# Patient Record
Sex: Male | Born: 1980 | Race: White | Hispanic: No | Marital: Married | Smoking: Current every day smoker
Health system: Southern US, Community
[De-identification: ages and names within clinical notes are randomized; demographics above are authoritative.]

## PROBLEM LIST (undated history)

## (undated) DIAGNOSIS — F1910 Other psychoactive substance abuse, uncomplicated: Secondary | ICD-10-CM

---

## 2015-12-30 ENCOUNTER — Emergency Department
Admission: EM | Admit: 2015-12-30 | Discharge: 2015-12-30 | Discharge status: Against Medical Advice | Payer: Self-pay | Attending: Emergency Medicine | Admitting: Emergency Medicine

## 2015-12-30 ENCOUNTER — Encounter: Payer: Self-pay | Admitting: Emergency Medicine

## 2015-12-30 ENCOUNTER — Emergency Department: Payer: Self-pay

## 2015-12-30 DIAGNOSIS — Z9189 Other specified personal risk factors, not elsewhere classified: Secondary | ICD-10-CM | POA: Insufficient documentation

## 2015-12-30 DIAGNOSIS — F17200 Nicotine dependence, unspecified, uncomplicated: Secondary | ICD-10-CM | POA: Insufficient documentation

## 2015-12-30 HISTORY — DX: Other psychoactive substance abuse, uncomplicated: F19.10

## 2015-12-30 LAB — COMPREHENSIVE METABOLIC PANEL
ALBUMIN: 4.4 g/dL (ref 3.5–5.0)
ALT: 15 U/L — AB (ref 17–63)
AST: 27 U/L (ref 15–41)
Alkaline Phosphatase: 75 U/L (ref 38–126)
Anion gap: 9 (ref 5–15)
BUN: 10 mg/dL (ref 6–20)
CALCIUM: 9.3 mg/dL (ref 8.9–10.3)
CHLORIDE: 106 mmol/L (ref 101–111)
CO2: 22 mmol/L (ref 22–32)
CREATININE: 1.04 mg/dL (ref 0.61–1.24)
GFR calc non Af Amer: >60 mL/min (ref >60)
GLUCOSE: 127 mg/dL — AB (ref 65–99)
Potassium: 3.7 mmol/L (ref 3.5–5.1)
SODIUM: 137 mmol/L (ref 135–145)
Total Bilirubin: 0.8 mg/dL (ref 0.3–1.2)
Total Protein: 7.5 g/dL (ref 6.5–8.1)

## 2015-12-30 LAB — PROTIME-INR
INR: 1.14
Prothrombin Time: 14.8 seconds (ref 11.4–15.0)

## 2015-12-30 LAB — CBC WITH DIFFERENTIAL/PLATELET
BASOS ABS: 0 10*3/uL (ref 0–0.1)
Basophils Relative: 0 %
Eosinophils Absolute: 0.1 10*3/uL (ref 0–0.7)
HCT: 40.2 % (ref 40.0–52.0)
Hemoglobin: 13.9 g/dL (ref 13.0–18.0)
Lymphs Abs: 1.7 10*3/uL (ref 1.0–3.6)
MCH: 29 pg (ref 26.0–34.0)
MCHC: 34.5 g/dL (ref 32.0–36.0)
MCV: 84.2 fL (ref 80.0–100.0)
MONO ABS: 0.8 10*3/uL (ref 0.2–1.0)
Neutro Abs: 7.5 10*3/uL — ABNORMAL HIGH (ref 1.4–6.5)
Neutrophils Relative %: 74 %
PLATELETS: 217 10*3/uL (ref 150–440)
RBC: 4.78 MIL/uL (ref 4.40–5.90)
RDW: 12.7 % (ref 11.5–14.5)
WBC: 10.1 10*3/uL (ref 3.8–10.6)

## 2015-12-30 LAB — ACETAMINOPHEN LEVEL

## 2015-12-30 LAB — ETHANOL: Alcohol, Ethyl (B): <5 mg/dL (ref <5)

## 2015-12-30 LAB — TROPONIN I

## 2015-12-30 NOTE — ED Notes (Signed)

## 2015-12-30 NOTE — ED Notes (Signed)
He is standing at the nurse's station on B side - anxiously talking with the secretary  "I want to see my wife."  I escorted him to the lobby  He was informed that he can ask questions about the whereabouts of his wife in the lobby

## 2015-12-30 NOTE — ED Notes (Signed)
Pt changed out from street cloths to behavior clothing

## 2015-12-30 NOTE — ED Notes (Signed)
Pt to CT scan at this time.

## 2015-12-30 NOTE — ED Notes (Signed)
I spoke with McShane about what med he would like to give this pt  MD reports "no narcotics"  I informed the pt of the md's findings  Pt states  "Man this is bullshit - I am just gonna leave - I am voluntary - I am gonna leave."

## 2015-12-30 NOTE — ED Notes (Signed)
Pt left ama 

## 2015-12-30 NOTE — ED Notes (Signed)
MD informed of pts desire to leave - CT results are negative  MD aware

## 2015-12-30 NOTE — ED Notes (Signed)
Pt requesting methadone since he missed his appt two days ago  Pt states  "I have been off my methadone for two days and the doctor said he was going to order me some - go get it."
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
## 2015-12-30 NOTE — ED Notes (Signed)
2/2 bags of belongings have been returned to him and he has verbalized that he received back all belongings that he came here with
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
## 2017-05-21 ENCOUNTER — Encounter: Payer: Self-pay | Admitting: Emergency Medicine

## 2017-05-21 ENCOUNTER — Emergency Department
Admission: EM | Admit: 2017-05-21 | Discharge: 2017-05-21 | Disposition: A | Discharge status: Home / Self Care | Payer: Medicaid Other | Attending: Emergency Medicine | Admitting: Emergency Medicine

## 2017-05-21 DIAGNOSIS — L03114 Cellulitis of left upper limb: Secondary | ICD-10-CM | POA: Diagnosis not present

## 2017-05-21 DIAGNOSIS — R2232 Localized swelling, mass and lump, left upper limb: Secondary | ICD-10-CM | POA: Diagnosis present

## 2017-05-21 DIAGNOSIS — F17200 Nicotine dependence, unspecified, uncomplicated: Secondary | ICD-10-CM | POA: Diagnosis not present

## 2017-05-21 MED ORDER — CLINDAMYCIN HCL 300 MG PO CAPS: 300.0000 mg | ORAL_CAPSULE | Freq: Three times a day (TID) | ORAL | 0 refills | Status: AC

## 2017-05-21 MED ORDER — CLINDAMYCIN HCL 150 MG PO CAPS
300.0000 mg | ORAL_CAPSULE | Freq: Once | ORAL | Status: AC
  Administered 2017-05-21: 300 mg via ORAL
  Filled 2017-05-21: qty 2

## 2017-05-21 NOTE — ED Triage Notes (Signed)
Patient presents to the ED with swollen left elbow since yesterday morning.  Elbow is also reddened.  Patient denies any injury to his elbow.

## 2017-05-21 NOTE — Discharge Instructions (Signed)
You have a skin infection. It is probably due to an insect bite you scratched. Keep the wound (bite) clean, dry, and covered. Take the antibiotic as directed, until all pills are gone. [facility] this ED for worsening symptoms as discussed.

## 2017-05-21 NOTE — ED Provider Notes (Signed)
[facility] Emergency Department Provider Note ____________________________________________  Time seen: 1236  I have reviewed the triage vital signs and the nursing notes.  HISTORY  Chief Complaint  Joint Swelling  HPI Gregory Cohen is a 36 y.o. male Presents to the ED for evaluation of swelling around the left elbow since yesterday morning. Patient does admit to having what he thought was a small insect bite. He scratched the area several days prior, and now presents with redness and warmth to the skin overlying the elbow. He denies any difficulty with elbow range of motion, fevers, chills, or sweats. He also denies any local trauma, abscess formation or spontaneous drainage.  Past Medical History:  Diagnosis Date  . Drug abuse (HCC)     There are no active problems to display for this patient.   History reviewed. No pertinent surgical history.  Prior to Admission medications   Medication Sig Start Date End Date Taking? Authorizing Provider  clindamycin (CLEOCIN) 300 MG capsule Take 1 capsule (300 mg total) by mouth 3 (three) times daily. 05/21/17 05/31/17  Roshad Hack, Charlesetta IvoryJenise V Bacon, PA-C   Allergies Patient has no known allergies.  No family history on file.  Social History Social History  Substance Use Topics  . Smoking status: Current Every Day Smoker  . Smokeless tobacco: Never Used  . Alcohol use Yes    Review of Systems  Constitutional: Negative for fever. Cardiovascular: Negative for chest pain. Respiratory: Negative for shortness of breath. Musculoskeletal: Negative for back pain. Skin: Negative for rash. Reddened left elbow as above Neurological: Negative for headaches, focal weakness or numbness. ____________________________________________  PHYSICAL EXAM:  VITAL SIGNS: ED Triage Vitals  Enc Vitals Group     BP 05/21/17 1202 133/85     Pulse Rate 05/21/17 1202 87     Resp 05/21/17 1202 16     Temp 05/21/17 1202 98.6 F (37 C)      Temp Source 05/21/17 1202 Oral     SpO2 05/21/17 1202 97 %     Weight 05/21/17 1203 170 lb (77.1 kg)     Height 05/21/17 1203 6' (1.829 m)     Head Circumference --      Peak Flow --      Pain Score 05/21/17 1203 6     Pain Loc --      Pain Edu? --      Excl. in GC? --     Constitutional: Alert and oriented. Well appearing and in no distress. Head: Normocephalic and atraumatic. Eyes: Conjunctivae are normal. Normal extraocular movements Cardiovascular: Normal rate, regular rhythm. Normal distal pulses. Respiratory: Normal respiratory effort.  Musculoskeletal: normal range of motion to the left elbow. Normal pronation supination of the forearm.Normal composite fist and grip strength. Nontender with normal range of motion in all extremities.  Neurologic:  Normal gait without ataxia. Normal speech and language. No gross focal neurologic deficits are appreciated. Skin:  Skin is warm, dry and intact. No rash noted. The skin overlying the left elbow is markedly erythematous, warm, and well demarcated. No lymphangitis, pointing fluctuance, or spontaneous drainage is appreciated. ____________________________________________  PROCEDURES  Cleocin 300 mg PO Ace wrap for protection Cellulitis Marked with skin marker ____________________________________________  INITIAL IMPRESSION / ASSESSMENT AND PLAN / ED COURSE  Patient with ED evaluation of a nonpurulent erythematous saline distal left elbow. Patient's presentation is consistent with a likely staph infection. No focal abscess formation is appreciated. Patient is discharged with the perception for clindamycin to dose as  directed. He will keep the elbow wrapped with Ace bandages for protection. Follow-up with primary pediatrician or return to the ED as needed for worsening symptoms. ____________________________________________  FINAL CLINICAL IMPRESSION(S) / ED DIAGNOSES  Final diagnoses:  Cellulitis of left upper extremity       Goran Olden, Charlesetta Ivory, PA-C 05/21/17 1522    Governor Rooks, MD 05/21/17 1531
